# Patient Record
Sex: Male | Born: 1995 | Race: Black or African American | Hispanic: No | Marital: Single | Smoking: Never smoker
Health system: Southern US, Community
[De-identification: ages and names within clinical notes are randomized; demographics above are authoritative.]

---
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
## 2016-11-29 ENCOUNTER — Ambulatory Visit (HOSPITAL_COMMUNITY)
Admission: EM | Admit: 2016-11-29 | Discharge: 2016-11-29 | Disposition: A | Discharge status: Home / Self Care | Payer: Medicaid Other | Attending: Family Medicine | Admitting: Family Medicine

## 2016-11-29 ENCOUNTER — Encounter (HOSPITAL_COMMUNITY): Payer: Self-pay | Admitting: *Deleted

## 2016-11-29 DIAGNOSIS — Z113 Encounter for screening for infections with a predominantly sexual mode of transmission: Secondary | ICD-10-CM

## 2016-11-29 DIAGNOSIS — R369 Urethral discharge, unspecified: Secondary | ICD-10-CM | POA: Insufficient documentation

## 2016-11-29 DIAGNOSIS — Z202 Contact with and (suspected) exposure to infections with a predominantly sexual mode of transmission: Secondary | ICD-10-CM | POA: Insufficient documentation

## 2016-11-29 MED ORDER — AZITHROMYCIN 250 MG PO TABS
1000.0000 mg | ORAL_TABLET | Freq: Once | ORAL | Status: AC
  Administered 2016-11-29: 1000 mg via ORAL

## 2016-11-29 MED ORDER — CEFTRIAXONE SODIUM 250 MG IJ SOLR
250.0000 mg | Freq: Once | INTRAMUSCULAR | Status: AC
  Administered 2016-11-29: 250 mg via INTRAMUSCULAR

## 2016-11-29 MED ORDER — CEFTRIAXONE SODIUM 250 MG IJ SOLR
INTRAMUSCULAR | Status: AC
  Filled 2016-11-29: qty 250

## 2016-11-29 MED ORDER — AZITHROMYCIN 250 MG PO TABS
ORAL_TABLET | ORAL | Status: AC
  Filled 2016-11-29: qty 4

## 2016-11-29 NOTE — ED Triage Notes (Signed)
C/O penile discharge x 2 days.  Denies fevers. 

## 2016-11-29 NOTE — Discharge Instructions (Signed)
You have been screened for multiple types of infection diseases such as Gonorrhea, Chlamydia. You have also been screened for HIV and Syphilis. You will be notified of the results of these tests in 24-72 hours. If any therapy needs to be started or if your current therapy needs to be changed, you will be notified and and given instructions as to what to do. If your symptoms persist or fail to resolve, follow up with your primary care provider or return to clinic.   Based on your symptoms and findings on physical exam, you are being treated for an infection. You have received an injection of Rocephin, 250 mg and a tablet of Azithromycin, 1 gram. I would recommend following up with your primary care provider, the health department, or return to clinic in 1 week for re-screening to ensure clearance of the infection.

## 2016-11-29 NOTE — ED Provider Notes (Signed)
CSN: [id number]     Arrival date & time 11/29/16  1329 History   None    Chief Complaint  Patient presents with  . Penile Discharge   (Consider location/radiation/quality/duration/timing/severity/associated sxs/prior Treatment) 21 year old male presents to clinic with 2 day history of dysuria and penile discharge. He is sexually active, 1 partner, intermittent condom use. Denies fever, abdominal pain, flank pain, nausea, vomiting, or other systemic symptoms.   The history is provided by the patient.  Penile Discharge     History reviewed. No pertinent past medical history. History reviewed. No pertinent surgical history. No family history on file. Social History  Substance Use Topics  . Smoking status: Never Smoker  . Smokeless tobacco: Never Used  . Alcohol use No    Review of Systems  Reason unable to perform ROS: as covered in HPI.  Genitourinary: Positive for discharge.  All other systems reviewed and are negative.   Allergies  Patient has no known allergies.  Home Medications   Prior to Admission medications   Medication Sig Start Date End Date Taking? Authorizing Provider  albuterol (PROVENTIL HFA;VENTOLIN HFA) 108 (90 BASE) MCG/ACT inhaler Inhale 2 puffs into the lungs every 6 (six) hours as needed. For wheezing    Historical Provider, MD   Meds Ordered and Administered this Visit   Medications  azithromycin (ZITHROMAX) tablet 1,000 mg (1,000 mg Oral Given 11/29/16 1552)  cefTRIAXone (ROCEPHIN) injection 250 mg (250 mg Intramuscular Given 11/29/16 1553)    BP 112/66   Pulse 63   Temp 98.4 F (36.9 C) (Oral)   Resp 16   SpO2 100%  No data found.   Physical Exam  Constitutional: He is oriented to person, place, and time. He appears well-developed and well-nourished. No distress.  Pulmonary/Chest: Effort normal and breath sounds normal.  Abdominal: Soft. Bowel sounds are normal. He exhibits no distension. There is no tenderness. There is no guarding.   Genitourinary:  Genitourinary Comments: Deferred  Neurological: He is alert and oriented to person, place, and time.  Skin: Skin is warm and dry. Capillary refill takes less than 2 seconds. He is not diaphoretic.  Psychiatric: He has a normal mood and affect.  Nursing note and vitals reviewed.   Urgent Care Course     Procedures (including critical care time)  Labs Review Labs Reviewed  HIV ANTIBODY (ROUTINE TESTING)  RPR  URINE CYTOLOGY ANCILLARY ONLY    Imaging Review No results found.   Visual Acuity Review  Right Eye Distance:   Left Eye Distance:   Bilateral Distance:    Right Eye Near:   Left Eye Near:    Bilateral Near:         MDM   1. Penile discharge   2. Screening examination for STD (sexually transmitted disease)   You have been screened for multiple types of infection diseases such as Gonorrhea, Chlamydia. You have also been screened for HIV and Syphilis. You will be notified of the results of these tests in 24-72 hours. If any therapy needs to be started or if your current therapy needs to be changed, you will be notified and and given instructions as to what to do. If your symptoms persist or fail to resolve, follow up with your primary care provider or return to clinic.   Based on your symptoms and findings on physical exam, you are being treated for an infection. You have received an injection of Rocephin, 250 mg and a tablet of Azithromycin, 1 gram. I  would recommend following up with your primary care provider, the health department, or return to clinic in 1 week for re-screening to ensure clearance of the infection.      Dorena BodoLawrence Aliha Diedrich, NP 11/29/16 [phone]

## 2016-11-30 LAB — HIV ANTIBODY (ROUTINE TESTING W REFLEX): HIV Screen 4th Generation wRfx: NONREACTIVE

## 2016-11-30 LAB — RPR: RPR: NONREACTIVE

## 2016-12-01 LAB — URINE CYTOLOGY ANCILLARY ONLY
CHLAMYDIA, DNA PROBE: NEGATIVE
NEISSERIA GONORRHEA: NEGATIVE

## 2017-04-20 ENCOUNTER — Emergency Department (HOSPITAL_COMMUNITY)
Admission: EM | Admit: 2017-04-20 | Discharge: 2017-04-20 | Disposition: A | Discharge status: Home / Self Care | Payer: Medicaid Other | Attending: Emergency Medicine | Admitting: Emergency Medicine

## 2017-04-20 ENCOUNTER — Encounter (HOSPITAL_COMMUNITY): Payer: Self-pay | Admitting: Emergency Medicine

## 2017-04-20 DIAGNOSIS — K649 Unspecified hemorrhoids: Secondary | ICD-10-CM

## 2017-04-20 MED ORDER — LIDOCAINE 5 % EX OINT: 1.0000 "application " | TOPICAL_OINTMENT | CUTANEOUS | 0 refills | Status: DC | PRN

## 2017-04-20 MED ORDER — IBUPROFEN 600 MG PO TABS: 600.0000 mg | ORAL_TABLET | Freq: Four times a day (QID) | ORAL | 0 refills | Status: DC | PRN

## 2017-04-20 MED ORDER — HYDROCORTISONE 1 % EX CREA: TOPICAL_CREAM | CUTANEOUS | 0 refills | Status: DC

## 2017-04-20 MED ORDER — CICLOPIROX OLAMINE 0.77 % EX CREA: TOPICAL_CREAM | Freq: Two times a day (BID) | CUTANEOUS | 0 refills | Status: DC

## 2017-04-20 NOTE — ED Provider Notes (Signed)
[facility] Provider Note   CSN: [id number] Arrival date & time: 04/20/17  1347  By signing my name below, I, Jeffrey Glenn, attest that this documentation has been prepared under the direction and in the presence of Kathlyn Leachman. Electronically Signed: Thelma Glenn, Scribe. 04/20/17. 3:58 PM.  History   Chief Complaint Chief Complaint  Patient presents with  . Wound Infection   The history is provided by the patient. No language interpreter was used.    HPI Comments: Jeffrey Glenn is a 21 y.o. male who presents to the Emergency Department complaining of a pain in his anus area since 2 days. He states there was pain 2 days ago but the pain is currently not there. He has not tried using any medications for his symptoms. He states he feels a bump in the area. He denies any bleeding, draining, or pain during a BM. He has had this before but it went away on its own. History reviewed. No pertinent past medical history.  Patient Active Problem List   Diagnosis Date Noted  . 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 HEADACHE 07/19/2008  . LYME DISEASE 03/10/2008    History reviewed. No pertinent surgical history.     Home Medications    Prior to Admission medications   Medication Sig Start Date End Date Taking? Authorizing Provider  albuterol (PROVENTIL HFA;VENTOLIN HFA) 108 (90 BASE) MCG/ACT inhaler Inhale 2 puffs into the lungs every 6 (six) hours as needed. For wheezing    [provider]    Family History No family history on file.  Social History Social History  Substance Use Topics  . Smoking status: Never Smoker  . Smokeless tobacco: Never Used  . Alcohol use No     Allergies   Patient has no known allergies.   Review of Systems Review of Systems  Constitutional: Negative for chills and fever.  Gastrointestinal: Negative for anal bleeding.  Skin: Positive for wound.     Physical Exam Updated Vital Signs BP 116/65 (BP Location: Left Arm)    Pulse 70   Temp 98.4 F (36.9 C) (Oral)   Resp 14   SpO2 99%   Physical Exam  Constitutional: He is oriented to person, place, and time. He appears well-developed and well-nourished.  HENT:  Head: Normocephalic and atraumatic.  Cardiovascular: Normal rate.   Pulmonary/Chest: Effort normal.  Genitourinary:  Genitourinary Comments: 1 small approximately 1 cm hemorrhoid to the right of the rectum, soft, nonthrombosed, nonbleeding  Neurological: He is alert and oriented to person, place, and time.  Skin: Skin is warm and dry.  Psychiatric: He has a normal mood and affect.  Nursing note and vitals reviewed.    ED Treatments / Results  DIAGNOSTIC STUDIES: Oxygen Saturation is 99% on RA, normal by my interpretation.    COORDINATION OF CARE: 3:58 PM Discussed treatment plan with pt at bedside and pt agreed to plan.  Labs (all labs ordered are listed, but only abnormal results are displayed) Labs Reviewed - No data to display  EKG  EKG Interpretation None       Radiology No results found.  Procedures Procedures (including critical care time)  Medications Ordered in ED Medications - No data to display   Initial Impression / Assessment and Plan / ED Course  I have reviewed the triage vital signs and the nursing notes.  Pertinent labs & imaging results that were available during my care of the patient were reviewed by me and considered in my medical decision  making (see chart for details).    Patient in ED with a nonthrombosed hemorrhoid, I do not think he needs any ID today. Will treat with sitz baths, hydrocortisone cream, lidocaine for pain. Advised to take a stool softener. Follow-up as needed.  Vitals:   04/20/17 1417  BP: 116/65  Pulse: 70  Resp: 14  Temp: 98.4 F (36.9 C)  TempSrc: Oral  SpO2: 99%     Final Clinical Impressions(s) / ED Diagnoses   Final diagnoses:  Hemorrhoids, unspecified hemorrhoid type    New Prescriptions Discharge  Medication List as of 04/20/2017  4:04 PM    START taking these medications   Details  hydrocortisone cream 1 % Apply to affected area 2 times daily, Print    ibuprofen (ADVIL,MOTRIN) 600 MG tablet Take 1 tablet (600 mg total) by mouth every 6 (six) hours as needed., Starting Mon 04/20/2017, Print    lidocaine (XYLOCAINE) 5 % ointment Apply 1 application topically as needed., Starting Mon 04/20/2017, Print        I personally performed the services described in this documentation, which was scribed in my presence. The recorded information has been reviewed and is accurate.     Jaynie CrumbleKirichenko, Charmine Bockrath, PA-C 04/20/17 1635    Lavera GuiseLiu, Dana Duo, MD 04/20/17 2216

## 2017-04-20 NOTE — Discharge Instructions (Signed)
Warm sitz bath several times a day. Lidocaine for pain. Hydrocortisone for swelling. Follow up as needed.

## 2017-04-20 NOTE — ED Triage Notes (Signed)
Has a bump by his bottom that hurt on sat now it is just a bump states  No drainage

## 2017-04-20 NOTE — ED Notes (Signed)
See EDP assessment 

## 2018-10-31 ENCOUNTER — Ambulatory Visit (HOSPITAL_COMMUNITY)
Admission: EM | Admit: 2018-10-31 | Discharge: 2018-10-31 | Disposition: A | Discharge status: Home / Self Care | Payer: BLUE CROSS/BLUE SHIELD | Attending: Family Medicine | Admitting: Family Medicine

## 2018-10-31 ENCOUNTER — Encounter (HOSPITAL_COMMUNITY): Payer: Self-pay | Admitting: Emergency Medicine

## 2018-10-31 DIAGNOSIS — R112 Nausea with vomiting, unspecified: Secondary | ICD-10-CM | POA: Diagnosis not present

## 2018-10-31 DIAGNOSIS — R6889 Other general symptoms and signs: Secondary | ICD-10-CM | POA: Insufficient documentation

## 2018-10-31 DIAGNOSIS — R509 Fever, unspecified: Secondary | ICD-10-CM

## 2018-10-31 MED ORDER — ONDANSETRON 4 MG PO TBDP: 4.0000 mg | ORAL_TABLET | Freq: Three times a day (TID) | ORAL | 0 refills | Status: DC | PRN

## 2018-10-31 MED ORDER — ACETAMINOPHEN 325 MG PO TABS
650.0000 mg | ORAL_TABLET | Freq: Once | ORAL | Status: AC
  Administered 2018-10-31: 650 mg via ORAL

## 2018-10-31 MED ORDER — ONDANSETRON 4 MG PO TBDP
4.0000 mg | ORAL_TABLET | Freq: Once | ORAL | Status: AC
  Administered 2018-10-31: 4 mg via ORAL

## 2018-10-31 MED ORDER — ACETAMINOPHEN 325 MG PO TABS
ORAL_TABLET | ORAL | Status: AC
  Filled 2018-10-31: qty 2

## 2018-10-31 MED ORDER — ONDANSETRON 4 MG PO TBDP
ORAL_TABLET | ORAL | Status: AC
  Filled 2018-10-31: qty 1

## 2018-10-31 NOTE — ED Provider Notes (Signed)
[facility]    CSN: [id number] Arrival date & time: 10/31/18  1124     History   Chief Complaint Chief Complaint  Patient presents with  . URI    HPI Jeffrey Glenn is a 23 y.o. male.   23 year old male comes in for 2 day history of flu like symptoms. He has had cough, tactile fever, body aches, chills. He denies rhinorrhea, nasal congestion. Has nausea with vomiting. States about 3-4 episodes of nonbilious nonbloody vomit per day. Has had trouble tolerating oral intake. Mild LLQ abdominal pain, denies diarrhea, constipation. nyquil without relief. No obvious sick contact.      History reviewed. No pertinent past medical history.  Patient Active Problem List   Diagnosis Date Noted  . 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 HEADACHE 07/19/2008  . LYME DISEASE 03/10/2008    History reviewed. No pertinent surgical history.     Home Medications    Prior to Admission medications   Medication Sig Start Date End Date Taking? Authorizing Provider  albuterol (PROVENTIL HFA;VENTOLIN HFA) 108 (90 BASE) MCG/ACT inhaler Inhale 2 puffs into the lungs every 6 (six) hours as needed. For wheezing    [provider]  hydrocortisone cream 1 % Apply to affected area 2 times daily 04/20/17   Kirichenko, Lemont Fillers, PA-C  ibuprofen (ADVIL,MOTRIN) 600 MG tablet Take 1 tablet (600 mg total) by mouth every 6 (six) hours as needed. 04/20/17   Kirichenko, Tatyana, PA-C  lidocaine (XYLOCAINE) 5 % ointment Apply 1 application topically as needed. Patient not taking: Reported on 10/31/2018 04/20/17   Jaynie Crumble, PA-C  ondansetron (ZOFRAN ODT) 4 MG disintegrating tablet Take 1 tablet (4 mg total) by mouth every 8 (eight) hours as needed for nausea or vomiting. 10/31/18   Belinda Fisher, PA-C    Family History No family history on file.  Social History Social History   Tobacco Use  . Smoking status: Never Smoker  . Smokeless tobacco: Never Used  Substance Use Topics  . Alcohol use:  No  . Drug use: Yes    Types: Marijuana     Allergies   Patient has no known allergies.   Review of Systems Review of Systems  Reason unable to perform ROS: See HPI as above.     Physical Exam Triage Vital Signs ED Triage Vitals  Enc Vitals Group     BP 10/31/18 1221 (!) 132/93     Pulse Rate 10/31/18 1221 89     Resp 10/31/18 1221 18     Temp 10/31/18 1221 (!) 101.1 F (38.4 C)     Temp src --      SpO2 10/31/18 1221 100 %     Weight --      Height --      Head Circumference --      Peak Flow --      Pain Score 10/31/18 1222 6     Pain Loc --      Pain Edu? --      Excl. in GC? --    No data found.  Updated Vital Signs BP (!) 132/93   Pulse 89   Temp (!) 101.1 F (38.4 C)   Resp 18   SpO2 100%   Physical Exam Constitutional:      General: He is not in acute distress.    Appearance: He is well-developed. He is not ill-appearing, toxic-appearing or diaphoretic.  HENT:     Head: Normocephalic and atraumatic.  Right Ear: Ear canal and external ear normal.     Left Ear: Ear canal and external ear normal.     Ears:     Comments: Bilateral cerumen impaction, TM not visible.     Nose: Nose normal.     Right Sinus: No maxillary sinus tenderness or frontal sinus tenderness.     Left Sinus: No maxillary sinus tenderness or frontal sinus tenderness.     Mouth/Throat:     Pharynx: Uvula midline.  Eyes:     Conjunctiva/sclera: Conjunctivae normal.     Pupils: Pupils are equal, round, and reactive to light.  Neck:     Musculoskeletal: Normal range of motion and neck supple.  Cardiovascular:     Rate and Rhythm: Normal rate and regular rhythm.     Heart sounds: Normal heart sounds. No murmur. No friction rub. No gallop.   Pulmonary:     Effort: Pulmonary effort is normal.     Breath sounds: Normal breath sounds. No decreased breath sounds, wheezing, rhonchi or rales.  Abdominal:     General: Abdomen is flat. Bowel sounds are normal.     Palpations:  Abdomen is soft.     Tenderness: There is no abdominal tenderness. There is no guarding or rebound.  Lymphadenopathy:     Cervical: No cervical adenopathy.  Skin:    General: Skin is warm and dry.  Neurological:     Mental Status: He is alert and oriented to person, place, and time.  Psychiatric:        Behavior: Behavior normal.        Judgment: Judgment normal.      UC Treatments / Results  Labs (all labs ordered are listed, but only abnormal results are displayed) Labs Reviewed - No data to display  EKG None  Radiology No results found.  Procedures Procedures (including critical care time)  Medications Ordered in UC Medications  ondansetron (ZOFRAN-ODT) disintegrating tablet 4 mg (has no administration in time range)  acetaminophen (TYLENOL) tablet 650 mg (650 mg Oral Given 10/31/18 1226)    Initial Impression / Assessment and Plan / UC Course  I have reviewed the triage vital signs and the nursing notes.  Pertinent labs & imaging results that were available during my care of the patient were reviewed by me and considered in my medical decision making (see chart for details).    Discussed flu like symptoms, outside of tamiflu treatment range. Will provide zofran for nausea/vomiting. Other symptomatic treatment discussed. Push fluids. Return precautions given.  Final Clinical Impressions(s) / UC Diagnoses   Final diagnoses:  Flu-like symptoms  Intractable vomiting with nausea, unspecified vomiting type    ED Prescriptions    Medication Sig Dispense Auth. Provider   ondansetron (ZOFRAN ODT) 4 MG disintegrating tablet Take 1 tablet (4 mg total) by mouth every 8 (eight) hours as needed for nausea or vomiting. 20 tablet Threasa Alpha, PA-C 10/31/18 1300

## 2018-10-31 NOTE — Discharge Instructions (Signed)
Zofran for nausea and vomiting as needed. Alternate tylenol and motrin every 4 hours to help with fever and body aches. Keep hydrated, you urine should be clear to pale yellow in color. Bland diet, advance as tolerated.  Monitor for any worsening of symptoms, chest pain, shortness of breath, wheezing, swelling of the throat, follow up for reevaluation.   For sore throat/cough try using a honey-based tea. Use 3 teaspoons of honey with juice squeezed from half lemon. Place shaved pieces of ginger into 1/2-1 cup of water and warm over stove top. Then mix the ingredients and repeat every 4 hours as needed.

## 2018-10-31 NOTE — ED Triage Notes (Signed)
Pt c/o vomiting, chills, x2 days.

## 2018-11-01 DIAGNOSIS — Z5321 Procedure and treatment not carried out due to patient leaving prior to being seen by health care provider: Secondary | ICD-10-CM | POA: Insufficient documentation

## 2018-11-01 DIAGNOSIS — R079 Chest pain, unspecified: Secondary | ICD-10-CM | POA: Insufficient documentation

## 2018-11-02 ENCOUNTER — Emergency Department (HOSPITAL_COMMUNITY)
Admission: EM | Admit: 2018-11-02 | Discharge: 2018-11-02 | Discharge status: Against Medical Advice | Payer: BLUE CROSS/BLUE SHIELD | Attending: Emergency Medicine | Admitting: Emergency Medicine

## 2019-05-31 ENCOUNTER — Encounter (HOSPITAL_COMMUNITY): Payer: Self-pay

## 2019-05-31 ENCOUNTER — Emergency Department (HOSPITAL_COMMUNITY): Payer: BC Managed Care – PPO

## 2019-05-31 ENCOUNTER — Emergency Department (HOSPITAL_COMMUNITY)
Admission: EM | Admit: 2019-05-31 | Discharge: 2019-05-31 | Disposition: A | Discharge status: Home / Self Care | Payer: BC Managed Care – PPO | Attending: Emergency Medicine | Admitting: Emergency Medicine

## 2019-05-31 ENCOUNTER — Other Ambulatory Visit: Payer: Self-pay

## 2019-05-31 DIAGNOSIS — Y92410 Unspecified street and highway as the place of occurrence of the external cause: Secondary | ICD-10-CM | POA: Insufficient documentation

## 2019-05-31 DIAGNOSIS — S39012A Strain of muscle, fascia and tendon of lower back, initial encounter: Secondary | ICD-10-CM | POA: Diagnosis not present

## 2019-05-31 DIAGNOSIS — Y999 Unspecified external cause status: Secondary | ICD-10-CM | POA: Diagnosis not present

## 2019-05-31 DIAGNOSIS — Z79899 Other long term (current) drug therapy: Secondary | ICD-10-CM | POA: Insufficient documentation

## 2019-05-31 DIAGNOSIS — S3992XA Unspecified injury of lower back, initial encounter: Secondary | ICD-10-CM | POA: Diagnosis present

## 2019-05-31 DIAGNOSIS — M25512 Pain in left shoulder: Secondary | ICD-10-CM | POA: Diagnosis not present

## 2019-05-31 DIAGNOSIS — Y9389 Activity, other specified: Secondary | ICD-10-CM | POA: Diagnosis not present

## 2019-05-31 MED ORDER — METHOCARBAMOL 500 MG PO TABS: 500.0000 mg | ORAL_TABLET | Freq: Two times a day (BID) | ORAL | 0 refills | Status: DC | PRN

## 2019-05-31 MED ORDER — NAPROXEN 500 MG PO TABS: 500.0000 mg | ORAL_TABLET | Freq: Two times a day (BID) | ORAL | 0 refills | Status: DC | PRN
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
## 2019-05-31 NOTE — Discharge Instructions (Signed)
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
## 2020-10-28 ENCOUNTER — Encounter (HOSPITAL_COMMUNITY): Payer: Self-pay | Admitting: Emergency Medicine

## 2020-10-28 ENCOUNTER — Other Ambulatory Visit: Payer: Self-pay

## 2020-10-28 ENCOUNTER — Emergency Department (HOSPITAL_COMMUNITY): Payer: No Typology Code available for payment source

## 2020-10-28 ENCOUNTER — Emergency Department (HOSPITAL_COMMUNITY)
Admission: EM | Admit: 2020-10-28 | Discharge: 2020-10-28 | Disposition: A | Discharge status: Home / Self Care | Payer: No Typology Code available for payment source | Attending: Emergency Medicine | Admitting: Emergency Medicine

## 2020-10-28 DIAGNOSIS — S6992XA Unspecified injury of left wrist, hand and finger(s), initial encounter: Secondary | ICD-10-CM | POA: Diagnosis present

## 2020-10-28 DIAGNOSIS — Y92410 Unspecified street and highway as the place of occurrence of the external cause: Secondary | ICD-10-CM | POA: Insufficient documentation

## 2020-10-28 DIAGNOSIS — S63502A Unspecified sprain of left wrist, initial encounter: Secondary | ICD-10-CM

## 2020-10-28 MED ORDER — IBUPROFEN 400 MG PO TABS
400.0000 mg | ORAL_TABLET | Freq: Once | ORAL | Status: AC
  Administered 2020-10-28: 400 mg via ORAL
  Filled 2020-10-28: qty 1
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
## 2020-10-28 NOTE — Discharge Instructions (Addendum)
It was our pleasure to provide your ER care today - we hope that you feel better.  Wear splint for comfort/support as need for one week.   Icepack to sore area.   Take acetaminophen and/or ibuprofen as need for pain.   Follow up with primary care doctor in 1-2 weeks if symptoms fail to improve/resolve.  Return to ER if worse, new symptoms, new or severe pain, severe headache, abdominal pain, or other concern.

## 2020-10-28 NOTE — Progress Notes (Signed)
Orthopedic Tech Progress Note Patient Details:  Jeffrey Glenn 05-Jul-1996 [id number]  Ortho Devices Type of Ortho Device: Velcro wrist splint Ortho Device/Splint Location: LUE Ortho Device/Splint Interventions: Adjustment,Application,Ordered   Post Interventions Patient Tolerated: Well Instructions Provided: Poper ambulation with device   [name] A Victoire Deans 10/28/2020, 7:02 PM

## 2021-08-05 ENCOUNTER — Other Ambulatory Visit: Payer: Self-pay

## 2021-08-05 ENCOUNTER — Encounter (HOSPITAL_COMMUNITY): Payer: Self-pay | Admitting: Emergency Medicine

## 2021-08-05 ENCOUNTER — Emergency Department (HOSPITAL_COMMUNITY)
Admission: EM | Admit: 2021-08-05 | Discharge: 2021-08-05 | Disposition: A | Discharge status: Home / Self Care | Payer: Medicaid Other | Attending: Emergency Medicine | Admitting: Emergency Medicine

## 2021-08-05 DIAGNOSIS — S4991XA Unspecified injury of right shoulder and upper arm, initial encounter: Secondary | ICD-10-CM | POA: Insufficient documentation

## 2021-08-05 DIAGNOSIS — T148XXA Other injury of unspecified body region, initial encounter: Secondary | ICD-10-CM

## 2021-08-05 DIAGNOSIS — S29012A Strain of muscle and tendon of back wall of thorax, initial encounter: Secondary | ICD-10-CM | POA: Insufficient documentation

## 2021-08-05 DIAGNOSIS — X58XXXA Exposure to other specified factors, initial encounter: Secondary | ICD-10-CM | POA: Insufficient documentation

## 2021-08-05 MED ORDER — CYCLOBENZAPRINE HCL 10 MG PO TABS: 10.0000 mg | ORAL_TABLET | Freq: Two times a day (BID) | ORAL | 0 refills | Status: DC | PRN

## 2021-08-05 MED ORDER — IBUPROFEN 600 MG PO TABS: 600.0000 mg | ORAL_TABLET | Freq: Four times a day (QID) | ORAL | 0 refills | Status: DC | PRN

## 2021-08-05 NOTE — ED Triage Notes (Signed)
Patient reports pain at right upper back onset yesterday , denies injury or fall , no strenuous activity , pain increases with movement / changing positions , respirations unlabored.

## 2021-08-05 NOTE — Discharge Instructions (Signed)
Take the medications as prescribed. Return here if symptoms worsen.

## 2021-08-05 NOTE — ED Provider Notes (Signed)
[facility] EMERGENCY DEPARTMENT Provider Note   CSN: [id number] Arrival date & time: 08/05/21  7341     History Chief Complaint  Patient presents with   Back Pain    Jeffrey Glenn is a 25 y.o. male.  Patient to ED with one day of pain in the posterior right shoulder. Worse with movement. No SoB, cough, chest pain or neck pain. No numbness or weakness. He is not aware of any injury or strain.   The history is provided by the patient. No language interpreter was used.  Back Pain Associated symptoms: no chest pain, no fever, no numbness and no weakness       History reviewed. No pertinent past medical history.  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 No pertinent surgical history.     No family history on file.  Social History   Tobacco Use   Smoking status: Never   Smokeless tobacco: Never  Substance Use Topics   Alcohol use: No   Drug use: Yes    Types: Marijuana    Home Medications Prior to Admission medications   Medication Sig Start Date End Date Taking? Authorizing Provider  cyclobenzaprine (FLEXERIL) 10 MG tablet Take 1 tablet (10 mg total) by mouth 2 (two) times daily as needed for muscle spasms. 08/05/21  Yes Eileene Kisling, Melvenia Beam, PA-C  ibuprofen (ADVIL) 600 MG tablet Take 1 tablet (600 mg total) by mouth every 6 (six) hours as needed. 08/05/21  Yes Terald Jump, Melvenia Beam, PA-C  albuterol (PROVENTIL HFA;VENTOLIN HFA) 108 (90 BASE) MCG/ACT inhaler Inhale 2 puffs into the lungs every 6 (six) hours as needed. For wheezing    [provider]  hydrocortisone cream 1 % Apply to affected area 2 times daily 04/20/17   Kirichenko, Tatyana, PA-C  lidocaine (XYLOCAINE) 5 % ointment Apply 1 application topically as needed. Patient not taking: Reported on 10/31/2018 04/20/17   Jaynie Crumble, PA-C  methocarbamol (ROBAXIN) 500 MG tablet  Take 1 tablet (500 mg total) by mouth 2 (two) times daily as needed for muscle spasms. 05/31/19   Ward, Chase Picket, PA-C  naproxen (NAPROSYN) 500 MG tablet Take 1 tablet (500 mg total) by mouth 2 (two) times daily as needed. 05/31/19   Ward, Chase Picket, PA-C  ondansetron (ZOFRAN ODT) 4 MG disintegrating tablet Take 1 tablet (4 mg total) by mouth every 8 (eight) hours as needed for nausea or vomiting. 10/31/18   Belinda Fisher, PA-C    Allergies    Patient has no known allergies.  Review of Systems   Review of Systems  Constitutional:  Negative for fever.  Respiratory: Negative.  Negative for cough and shortness of breath.   Cardiovascular: Negative.  Negative for chest pain.  Musculoskeletal:  Positive for back pain.       See HPI  Skin: Negative.   Neurological: Negative.  Negative for weakness and numbness.   Physical Exam Updated Vital Signs BP 100/86   Pulse 63   Temp 98.4 F (36.9 C) (Oral)   Resp 16   Ht 5\' 10"  (1.778 m)   Wt 76 kg   SpO2 100%   BMI 24.04 kg/m   Physical Exam Constitutional:      Appearance: He is well-developed.  Cardiovascular:     Rate and Rhythm: Normal rate.     Pulses: Normal pulses.  Pulmonary:  Effort: Pulmonary effort is normal.     Breath sounds: No wheezing, rhonchi or rales.  Chest:     Chest wall: No tenderness.  Musculoskeletal:        General: Normal range of motion.     Cervical back: Normal range of motion.       Back:  Skin:    General: Skin is warm and dry.  Neurological:     Mental Status: He is alert and oriented to person, place, and time.     Sensory: No sensory deficit.    ED Results / Procedures / Treatments   Labs (all labs ordered are listed, but only abnormal results are displayed) Labs Reviewed - No data to display  EKG None  Radiology No results found.  Procedures Procedures   Medications Ordered in ED Medications - No data to display  ED Course  I have reviewed the triage vital signs and the  nursing notes.  Pertinent labs & imaging results that were available during my care of the patient were reviewed by me and considered in my medical decision making (see chart for details).    MDM Rules/Calculators/A&P                           Patient to ED with posterior shoulder pain.   Follows a muscular pattern - worse with movement and palpation. No other concerning symptoms. Recommend supportive care.   Final Clinical Impression(s) / ED Diagnoses Final diagnoses:  Muscle strain    Rx / DC Orders ED Discharge Orders          Ordered    cyclobenzaprine (FLEXERIL) 10 MG tablet  2 times daily PRN        08/05/21 2221    ibuprofen (ADVIL) 600 MG tablet  Every 6 hours PRN        08/05/21 2221             Elpidio Anis, PA-C 08/05/21 2227    Pricilla Loveless, MD 08/09/21 1601

## 2024-03-31 ENCOUNTER — Ambulatory Visit (HOSPITAL_COMMUNITY)
Admission: EM | Admit: 2024-03-31 | Discharge: 2024-03-31 | Disposition: A | Discharge status: Home / Self Care | Attending: Family Medicine | Admitting: Family Medicine

## 2024-03-31 ENCOUNTER — Encounter (HOSPITAL_COMMUNITY): Payer: Self-pay

## 2024-03-31 DIAGNOSIS — Z113 Encounter for screening for infections with a predominantly sexual mode of transmission: Secondary | ICD-10-CM | POA: Diagnosis present

## 2024-03-31 LAB — HIV ANTIBODY (ROUTINE TESTING W REFLEX): HIV Screen 4th Generation wRfx: NONREACTIVE

## 2024-03-31 NOTE — ED Triage Notes (Signed)
 Patient presents to the office for STD testing. Denies any symptoms.

## 2024-03-31 NOTE — ED Provider Notes (Signed)
  [facility]   [id number] 03/31/24 Arrival Time: 1548  ASSESSMENT & PLAN:  1. Screening for STDs (sexually transmitted diseases)       Discharge Instructions      We have sent testing for STDs. We will notify you of any positive results once they are received. If required, we will prescribe any medications you might need.  Please refrain from all sexual activity for at least the next seven days.    Pending: Labs Reviewed  RPR  HIV ANTIBODY (ROUTINE TESTING W REFLEX)  CYTOLOGY, (ORAL, ANAL, URETHRAL) ANCILLARY ONLY    Will notify of any positive results.  Reviewed expectations re: course of current medical issues. Questions answered. Outlined signs and symptoms indicating need for more acute intervention. Patient verbalized understanding. After Visit Summary given.   SUBJECTIVE:  Jeffrey Glenn is a 28 y.o. male who requests STD testing. No symptoms.   OBJECTIVE:  Vitals:   03/31/24 1602  BP: 121/71  Pulse: (!) 57  Resp: 18  Temp: 98.4 F (36.9 C)  TempSrc: Oral  SpO2: 97%     General appearance: alert, cooperative, appears stated age and no distress GU: deferred Skin: warm and dry Psychological: alert and cooperative; normal mood and affect.    Labs Reviewed  RPR  HIV ANTIBODY (ROUTINE TESTING W REFLEX)  CYTOLOGY, (ORAL, ANAL, URETHRAL) ANCILLARY ONLY    No Known Allergies  History reviewed. No pertinent past medical history. History reviewed. No pertinent family history. Social History   Socioeconomic History   Marital status: Single    Spouse name: Not on file   Number of children: Not on file   Years of education: Not on file   Highest education level: Not on file  Occupational History   Not on file  Tobacco Use   Smoking status: Never   Smokeless tobacco: Never  Substance and Sexual Activity   Alcohol use: No   Drug use: Yes    Types: Marijuana   Sexual activity: Yes    Comment: occasional condom use  Other Topics  Concern   Not on file  Social History Narrative   Not on file   Social Drivers of Health   Financial Resource Strain: Not on file  Food Insecurity: Not on file  Transportation Needs: Not on file  Physical Activity: Not on file  Stress: Not on file  Social Connections: Not on file  Intimate Partner Violence: Not on file           Afton Albright, MD 03/31/24 1643

## 2024-03-31 NOTE — Discharge Instructions (Signed)
We have sent testing for STDs. We will notify you of any positive results once they are received. If required, we will prescribe any medications you might need.  Please refrain from all sexual activity for at least the next seven days.

## 2024-04-01 LAB — CYTOLOGY, (ORAL, ANAL, URETHRAL) ANCILLARY ONLY
Chlamydia: NEGATIVE
Comment: NEGATIVE
Comment: NEGATIVE
Comment: NORMAL
Neisseria Gonorrhea: NEGATIVE
Trichomonas: NEGATIVE

## 2024-04-01 LAB — RPR: RPR Ser Ql: NONREACTIVE
